# Patient Record
Sex: Male | Born: 1985 | Race: Black or African American | Hispanic: No | Marital: Single | Smoking: Never smoker
Health system: Southern US, Community
[De-identification: ages and names within clinical notes are randomized; demographics above are authoritative.]

## PROBLEM LIST (undated history)

## (undated) DIAGNOSIS — B1910 Unspecified viral hepatitis B without hepatic coma: Secondary | ICD-10-CM

## (undated) HISTORY — DX: Unspecified viral hepatitis B without hepatic coma: B19.10

---

## 2011-05-11 ENCOUNTER — Encounter: Payer: Self-pay | Admitting: Gastroenterology

## 2011-05-16 ENCOUNTER — Encounter: Payer: Self-pay | Admitting: Gastroenterology

## 2011-07-06 ENCOUNTER — Ambulatory Visit: Payer: Self-pay | Admitting: Gastroenterology

## 2012-03-14 ENCOUNTER — Ambulatory Visit (INDEPENDENT_AMBULATORY_CARE_PROVIDER_SITE_OTHER): Payer: Managed Care, Other (non HMO) | Admitting: Family Medicine

## 2012-03-14 VITALS — BP 138/76 | HR 71 | Temp 98.4°F | Resp 16 | Ht 67.5 in | Wt 193.8 lb

## 2012-03-14 DIAGNOSIS — Z0000 Encounter for general adult medical examination without abnormal findings: Secondary | ICD-10-CM

## 2012-03-14 DIAGNOSIS — H109 Unspecified conjunctivitis: Secondary | ICD-10-CM

## 2012-03-14 DIAGNOSIS — H1045 Other chronic allergic conjunctivitis: Secondary | ICD-10-CM

## 2012-03-14 DIAGNOSIS — Z113 Encounter for screening for infections with a predominantly sexual mode of transmission: Secondary | ICD-10-CM

## 2012-03-14 DIAGNOSIS — J069 Acute upper respiratory infection, unspecified: Secondary | ICD-10-CM

## 2012-03-14 DIAGNOSIS — H1010 Acute atopic conjunctivitis, unspecified eye: Secondary | ICD-10-CM

## 2012-03-14 LAB — POCT CBC
HCT, POC: 49.1 % (ref 43.5–53.7)
MCH, POC: 29.9 pg (ref 27–31.2)
MCV: 90.5 fL (ref 80–97)
MID (cbc): 0.5 (ref 0–0.9)
POC LYMPH PERCENT: 31 %L (ref 10–50)
Platelet Count, POC: 206 10*3/uL (ref 142–424)
RBC: 5.42 M/uL (ref 4.69–6.13)
WBC: 6.5 10*3/uL (ref 4.6–10.2)

## 2012-03-14 LAB — COMPREHENSIVE METABOLIC PANEL
Albumin: 4.6 g/dL (ref 3.5–5.2)
Alkaline Phosphatase: 72 U/L (ref 39–117)
BUN: 8 mg/dL (ref 6–23)
CO2: 28 mEq/L (ref 19–32)
Calcium: 10.2 mg/dL (ref 8.4–10.5)
Chloride: 107 mEq/L (ref 96–112)
Glucose, Bld: 96 mg/dL (ref 70–99)
Potassium: 4.2 mEq/L (ref 3.5–5.3)
Total Protein: 7.3 g/dL (ref 6.0–8.3)

## 2012-03-14 LAB — RPR

## 2012-03-14 MED ORDER — AZELASTINE HCL 0.05 % OP SOLN: 1.0000 [drp] | Freq: Two times a day (BID) | OPHTHALMIC | Status: AC

## 2012-03-14 NOTE — Progress Notes (Signed)
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 He is going to be getting married in July to a lady from his country was in [place]. He will be going back to the [place] him to get married. He is here for a premarital physical examination, desiring to have baseline lab testing and STD screening done to be able to present to his future in-laws.  He is fairly healthy with an essentially negative review of systems. HEENT normal with the exception of having had a little conjunctivitis for the past few days while riding an airplane he got this. He also has had a little upper respiratory congestion sore throat. He has some cardiovascular respiratory GI GU musculoskeletal, to logic are all negative.  Objective: HEENT: TMs are normal. Throat clear. Has a little bit of postnasal drainage. Neck supple without significant nodes. Eyes have minimal inflammation. Neck supple without nodes. Chest is clear. Heart regular without murmurs. It is soft without masses or tenderness. Normal male external genitalia testes descended. No lesions are noted. Extremities unremarkable. Skin has a few old scars on his legs otherwise unremarkable.  Assessment: Healthy male with normal premarital examination URI/allergic conjunctivitis  Plan:   Prescribed some eyedrops for him for the itching and crusting of the eyes. Also suggest she take some OTC Claritin D. Labs ordered include HIV, URiprobe, RPR, blood chemistries, CBC. His father had diabetes also get a hemoglobin A1c.

## 2012-03-14 NOTE — Patient Instructions (Signed)
Take claritin D daily as needed for congestion and sore throat

## 2012-03-15 LAB — GC/CHLAMYDIA PROBE AMP, URINE
Chlamydia, Swab/Urine, PCR: NEGATIVE
GC Probe Amp, Urine: NEGATIVE

## 2012-11-28 ENCOUNTER — Ambulatory Visit: Payer: Managed Care, Other (non HMO)

## 2012-11-30 ENCOUNTER — Ambulatory Visit (INDEPENDENT_AMBULATORY_CARE_PROVIDER_SITE_OTHER): Payer: Managed Care, Other (non HMO) | Admitting: Physician Assistant

## 2012-11-30 ENCOUNTER — Other Ambulatory Visit: Payer: Self-pay | Admitting: Physician Assistant

## 2012-11-30 VITALS — BP 128/76 | HR 114 | Temp 98.0°F | Resp 16 | Ht 68.0 in | Wt 185.0 lb

## 2012-11-30 DIAGNOSIS — Z111 Encounter for screening for respiratory tuberculosis: Secondary | ICD-10-CM

## 2012-11-30 DIAGNOSIS — Z7185 Encounter for immunization safety counseling: Secondary | ICD-10-CM

## 2012-11-30 DIAGNOSIS — Z23 Encounter for immunization: Secondary | ICD-10-CM

## 2012-11-30 DIAGNOSIS — Z7189 Other specified counseling: Secondary | ICD-10-CM

## 2012-11-30 NOTE — Progress Notes (Signed)
  Subjective:    Patient ID: Randy Jennings, male    DOB: 09/19/1986, 27 y.o.   MRN: [id number]  HPI 27 year old male presents for immunization review.  He is needing vaccines/titers for school.  He has no records of immunizations from childhood, although he states he has had all childhood immunizations.  Needs to re-start tetanus series and also requires MMR titers.  He does not have any forms that need to be completed and also does not need a physical.     Review of Systems  Constitutional: Negative for fever, chills and unexpected weight change.  Respiratory: Negative for cough.   Gastrointestinal: Negative for nausea and vomiting.  Neurological: Negative for headaches.       Objective:   Physical Exam  Constitutional: He is oriented to person, place, and time. He appears well-developed and well-nourished.  HENT:  Head: Normocephalic and atraumatic.  Right Ear: External ear normal.  Left Ear: External ear normal.  Eyes: Conjunctivae are normal.  Neck: Normal range of motion.  Cardiovascular: Normal rate.   Pulmonary/Chest: Effort normal.  Neurological: He is alert and oriented to person, place, and time.  Psychiatric: He has a normal mood and affect. His behavior is normal. Judgment and thought content normal.          Assessment & Plan:  1. Immunization counseling - Plan: Rubeola antibody, IgM, Mumps antibody, IgM, Rubella antibody, IgM  -titers pending  2. Need for Tdap vaccination - Plan: Tdap vaccine greater than or equal to 7yo IM  -1st Tdap given today. Return in 4 weeks for next dose.   3 Screening for tuberculosis  -patient screened according to guidelines and found to be free of TB in the communicable form.   -letter printed.

## 2012-11-30 NOTE — Progress Notes (Signed)
  Tuberculosis Risk Questionnaire  1. Were you born outside the [place] in one of the following parts of the world:    [place], [place], [place], [place] or [place]?  Yes [place]  2. Have you traveled outside the [place] and lived for more than one month in one of the following parts of the world:  [place], [place], [place], [place] or [place]?  Yes Lived in [place] and [place]  3. Do you have a compromised immune system such as from any of the following conditions:  HIV/AIDS, organ or bone marrow transplantation, diabetes, immunosuppressive   medicines (e.g. Prednisone, Remicaide), leukemia, lymphoma, cancer of the   head or neck, gastrectomy or jejunal bypass, end-stage renal disease (on   dialysis), or silicosis?  No    4. Have you ever done one of the following:    Used crack cocaine, injected illegal drugs, worked or resided in jail or prison,   worked or resided at a homeless shelter, or worked as a Research scientist (physical sciences) in   direct contact with patients?  No  5. Have you ever been exposed to anyone with infectious tuberculosis?  No   Tuberculosis Symptom Questionnaire  Do you currently have any of the following symptoms?  1. Unexplained cough lasting more than 3 weeks? No  Unexplained fever lasting more than 3 weeks. No   3. Night Sweats (sweating that leaves the bedclothes and sheets wet)   No  4. Shortness of Breath No  5. Chest Pain No  6. Unintentional weight loss  No  7. Unexplained fatigue (very tired for no reason) No

## 2012-12-04 LAB — MUMPS ANTIBODY, IGM: Mumps IgM Value: <1:20 {titer}

## 2012-12-04 LAB — RUBELLA ANTIBODY, IGM: Rubella IgM: 0.38 (ref <0.90)

## 2012-12-04 LAB — RUBEOLA ANTIBODY, IGM: Measles Ab IgM, IFA: <1:20 {titer}

## 2012-12-05 LAB — VARICELLA ZOSTER ANTIBODY, IGG: Varicella IgG: 713.3 Index — ABNORMAL HIGH (ref <135.00)

## 2012-12-05 LAB — MEASLES/MUMPS/RUBELLA IMMUNITY
Mumps IgG: 8.87 AU/mL (ref <9.00)
Rubella: 6.71 Index — ABNORMAL HIGH (ref <0.90)
Rubeola IgG: 159 AU/mL — ABNORMAL HIGH (ref <25.00)

## 2012-12-05 NOTE — Addendum Note (Signed)
Addended by: Nelva Nay on: 12/05/2012 07:07 PM   Modules accepted: Orders

## 2012-12-06 ENCOUNTER — Ambulatory Visit (INDEPENDENT_AMBULATORY_CARE_PROVIDER_SITE_OTHER): Payer: Managed Care, Other (non HMO)

## 2012-12-06 DIAGNOSIS — Z23 Encounter for immunization: Secondary | ICD-10-CM

## 2012-12-24 ENCOUNTER — Ambulatory Visit (INDEPENDENT_AMBULATORY_CARE_PROVIDER_SITE_OTHER): Payer: Managed Care, Other (non HMO) | Admitting: Physician Assistant

## 2012-12-24 VITALS — BP 132/86 | HR 92 | Temp 98.1°F | Resp 16 | Ht 69.0 in | Wt 190.0 lb

## 2012-12-24 DIAGNOSIS — Z23 Encounter for immunization: Secondary | ICD-10-CM

## 2012-12-24 NOTE — Progress Notes (Signed)
Patient here for MMR #2 and Tdap vaccination. Ok to give. Not seen by provider.

## 2013-02-22 ENCOUNTER — Ambulatory Visit (INDEPENDENT_AMBULATORY_CARE_PROVIDER_SITE_OTHER): Payer: Managed Care, Other (non HMO) | Admitting: Family Medicine

## 2013-02-22 VITALS — BP 112/70 | HR 96 | Temp 99.0°F | Resp 16 | Ht 68.0 in | Wt 186.6 lb

## 2013-02-22 DIAGNOSIS — K645 Perianal venous thrombosis: Secondary | ICD-10-CM

## 2013-02-22 MED ORDER — HYDROCORTISONE ACE-PRAMOXINE 2.5-1 % RE CREA: TOPICAL_CREAM | Freq: Three times a day (TID) | RECTAL | Status: DC

## 2013-02-22 NOTE — Patient Instructions (Signed)
Hemorrhoids Hemorrhoids are enlarged (dilated) veins around the rectum. There are 2 types of hemorrhoids, and the type of hemorrhoid is determined by its location. Internal hemorrhoids occur in the veins just inside the rectum.They are usually not painful, but they may bleed.However, they may poke through to the outside and become irritated and painful. External hemorrhoids involve the veins outside the anus and can be felt as a painful swelling or hard lump near the anus.They are often itchy and may crack and bleed. Sometimes clots will form in the veins. This makes them swollen and painful. These are called thrombosed hemorrhoids. CAUSES Causes of hemorrhoids include:  Pregnancy. This increases the pressure in the hemorrhoidal veins.  Constipation.  Straining to have a bowel movement.  Obesity.  Heavy lifting or other activity that caused you to strain. TREATMENT Most of the time hemorrhoids improve in 1 to 2 weeks. However, if symptoms do not seem to be getting better or if you have a lot of rectal bleeding, your caregiver may perform a procedure to help make the hemorrhoids get smaller or remove them completely.Possible treatments include:  Rubber band ligation. A rubber band is placed at the base of the hemorrhoid to cut off the circulation.  Sclerotherapy. A chemical is injected to shrink the hemorrhoid.  Infrared light therapy. Tools are used to burn the hemorrhoid.  Hemorrhoidectomy. This is surgical removal of the hemorrhoid. HOME CARE INSTRUCTIONS   Increase fiber in your diet. Ask your caregiver about using fiber supplements.  Drink enough water and fluids to keep your urine clear or pale yellow.  Exercise regularly.  Go to the bathroom when you have the urge to have a bowel movement. Do not wait.  Avoid straining to have bowel movements.  Keep the anal area dry and clean.  Only take over-the-counter or prescription medicines for pain, discomfort, or fever as  directed by your caregiver. If your hemorrhoids are thrombosed:  Take warm sitz baths for 20 to 30 minutes, 3 to 4 times per day.  If the hemorrhoids are very tender and swollen, place ice packs on the area as tolerated. Using ice packs between sitz baths may be helpful. Fill a plastic bag with ice. Place a towel between the bag of ice and your skin.  Medicated creams and suppositories may be used or applied as directed.  Do not use a donut-shaped pillow or sit on the toilet for long periods. This increases blood pooling and pain. SEEK MEDICAL CARE IF:   You have increasing pain and swelling that is not controlled with your medicine.  You have uncontrolled bleeding.  You have difficulty or you are unable to have a bowel movement.  You have pain or inflammation outside the area of the hemorrhoids.  You have chills or an oral temperature above 102 F (38.9 C). MAKE SURE YOU:   Understand these instructions.  Will watch your condition.  Will get help right away if you are not doing well or get worse. Document Released: 09/30/2000 Document Revised: 12/26/2011 Document Reviewed: 09/13/2010 [facility] Patient Information 2013 [place], [place].    Take Colace one daily for stool softener  Use the cream 2-3 times daily on hemorrhoid as directed

## 2013-02-22 NOTE — Progress Notes (Signed)
Subjective:  Patient presents here today complaining of a hemorrhoid. A few years ago he had one that he went to the emergency room for. History with suppositories and some cream. The past couple of days he has felt more it swelled up. He has not been particularly straining and his stools. 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 He has not had any bleeding, and not much pain.  Objective: Mildly tender external thrombosed hemorrhoid, about 1.5 CM's in diameter, not particularly inflamed.  Assessment: Thrombosed external hemorrhoid  Plan: Discuss treatment options. I think he is better treated conservatively at this time. If he gets more swollen or more painful he should come in and we will do an I&D on it.

## 2014-02-01 ENCOUNTER — Ambulatory Visit (INDEPENDENT_AMBULATORY_CARE_PROVIDER_SITE_OTHER): Payer: Managed Care, Other (non HMO) | Admitting: Family Medicine

## 2014-02-01 VITALS — BP 112/60 | HR 73 | Temp 98.3°F | Resp 14 | Ht 68.0 in | Wt 193.4 lb

## 2014-02-01 DIAGNOSIS — R519 Headache, unspecified: Secondary | ICD-10-CM

## 2014-02-01 DIAGNOSIS — R51 Headache: Secondary | ICD-10-CM

## 2014-02-01 DIAGNOSIS — G43109 Migraine with aura, not intractable, without status migrainosus: Secondary | ICD-10-CM

## 2014-02-01 LAB — POCT CBC
Granulocyte percent: 56.6 % (ref 37–80)
HCT, POC: 47.4 % (ref 43.5–53.7)
Hemoglobin: 15.6 g/dL (ref 14.1–18.1)
Lymph, poc: 1.8 (ref 0.6–3.4)
MCH, POC: 30.5 pg (ref 27–31.2)
MCHC: 32.9 g/dL (ref 31.8–35.4)
MCV: 92.6 fL (ref 80–97)
MID (cbc): 0.3 (ref 0–0.9)
MPV: 9.4 fL (ref 0–99.8)
POC Granulocyte: 2.8 (ref 2–6.9)
POC LYMPH PERCENT: 36.6 %L (ref 10–50)
POC MID %: 6.8 %M (ref 0–12)
Platelet Count, POC: 165 10*3/uL (ref 142–424)
RBC: 5.12 M/uL (ref 4.69–6.13)
RDW, POC: 14 %
WBC: 5 10*3/uL (ref 4.6–10.2)

## 2014-02-01 MED ORDER — BUTALBITAL-APAP-CAFFEINE 50-325-40 MG PO TABS: 1.0000 | ORAL_TABLET | Freq: Three times a day (TID) | ORAL | Status: AC | PRN

## 2014-02-01 NOTE — Patient Instructions (Signed)
Migraine Headache A migraine headache is an intense, throbbing pain on one or both sides of your head. A migraine can last for 30 minutes to several hours. CAUSES  The exact cause of a migraine headache is not always known. However, a migraine may be caused when nerves in the brain become irritated and release chemicals that cause inflammation. This causes pain. Certain things may also trigger migraines, such as:  Alcohol.  Smoking.  Stress.  Menstruation.  Aged cheeses.  Foods or drinks that contain nitrates, glutamate, aspartame, or tyramine.  Lack of sleep.  Chocolate.  Caffeine.  Hunger.  Physical exertion.  Fatigue.  Medicines used to treat chest pain (nitroglycerine), birth control pills, estrogen, and some blood pressure medicines. SIGNS AND SYMPTOMS  Pain on one or both sides of your head.  Pulsating or throbbing pain.  Severe pain that prevents daily activities.  Pain that is aggravated by any physical activity.  Nausea, vomiting, or both.  Dizziness.  Pain with exposure to bright lights, loud noises, or activity.  General sensitivity to bright lights, loud noises, or smells. Before you get a migraine, you may get warning signs that a migraine is coming (aura). An aura may include:  Seeing flashing lights.  Seeing bright spots, halos, or zig-zag lines.  Having tunnel vision or blurred vision.  Having feelings of numbness or tingling.  Having trouble talking.  Having muscle weakness. DIAGNOSIS  A migraine headache is often diagnosed based on:  Symptoms.  Physical exam.  A CT scan or MRI of your head. These imaging tests cannot diagnose migraines, but they can help rule out other causes of headaches. TREATMENT Medicines may be given for pain and nausea. Medicines can also be given to help prevent recurrent migraines.  HOME CARE INSTRUCTIONS  Only take over-the-counter or prescription medicines for pain or discomfort as directed by your  health care provider. The use of long-term narcotics is not recommended.  Lie down in a dark, quiet room when you have a migraine.  Keep a journal to find out what may trigger your migraine headaches. For example, write down:  What you eat and drink.  How much sleep you get.  Any change to your diet or medicines.  Limit alcohol consumption.  Quit smoking if you smoke.  Get 7 9 hours of sleep, or as recommended by your health care provider.  Limit stress.  Keep lights dim if bright lights bother you and make your migraines worse. SEEK IMMEDIATE MEDICAL CARE IF:   Your migraine becomes severe.  You have a fever.  You have a stiff neck.  You have vision loss.  You have muscular weakness or loss of muscle control.  You start losing your balance or have trouble walking.  You feel faint or pass out.  You have severe symptoms that are different from your first symptoms. MAKE SURE YOU:   Understand these instructions.  Will watch your condition.  Will get help right away if you are not doing well or get worse. Document Released: 10/03/2005 Document Revised: 07/24/2013 Document Reviewed: 06/10/2013 [employer] Patient Information 2014 [employer].  

## 2014-02-01 NOTE — Progress Notes (Signed)
Chief Complaint:  Chief Complaint  Patient presents with  . Headache    x 3 days    HPI: Randy Jennings is a 28 y.o. male who is here for  3 day history of headache where he has vision changes first , his vision gets blurred then abput 10-15 minutes he feels the pain in his head. He feels it "inside his head, in the center of his head". He has had HA in the past but nothing similar. It is constant. The vision changes usually involve blurred vision and white spots in his vision then 15 min later followed by  the headache. In the past he has never had vision changes. He has tried nothing for this except low dose ibuprofen . He has taken ibuprofen. Just 2 pills, the HA went away. The other day the HA lasted 2 hrs and went way. This one started 2 hours ago.   His sister has migraine HAs.  Denies neck pain, nause , vomitn, CP or SOb, paloputations.  He usually gets HA every 6 months He is deneis eye pain full HA, discomfort. No neck pain   No past medical history on file. No past surgical history on file. History   Social History  . Marital Status: Single    Spouse Name: N/A    Number of Children: N/A  . Years of Education: N/A   Social History Main Topics  . Smoking status: Never Smoker   . Smokeless tobacco: None  . Alcohol Use: 0.6 oz/week    1 Glasses of wine per week  . Drug Use: No  . Sexual Activity: Yes   Other Topics Concern  . None   Social History Narrative  . None   Family History  Problem Relation Age of Onset  . Diabetes Father    No Known Allergies Prior to Admission medications   Medication Sig Start Date End Date Taking? Authorizing Provider  hydrocortisone-pramoxine [facility](ANALPRAM-HC) 2.5-1 % rectal cream Place rectally 3 (three) times daily. 02/22/13  Yes Peyton Najjaravid H Hopper, MD     ROS: The patient denies fevers, chills, night sweats, unintentional weight loss, chest pain, palpitations, wheezing, dyspnea on exertion, nausea, vomiting, abdominal pain,  dysuria, hematuria, melena, numbness, weakness, or tingling.   All other systems have been reviewed and were otherwise negative with the exception of those mentioned in the HPI and as above.    PHYSICAL EXAM: Filed Vitals:   02/01/14 1605  BP: 112/60  Pulse: 73  Temp: 98.3 F (36.8 C)  Resp: 14  Spo2 99% Filed Vitals:   02/01/14 1605  Height: 5\' 8"  (1.727 m)  Weight: 193 lb 6.4 oz (87.726 kg)   Body mass index is 29.41 kg/(m^2).  General: Alert, no acute distress HEENT:  Normocephalic, atraumatic, oropharynx patent. EOMI, PERRLA, fundoscopic exam normal, TM nl.  Cardiovascular:  Regular rate and rhythm, no rubs murmurs or gallops.  No Carotid bruits, radial pulse intact. No pedal edema.  Respiratory: Clear to auscultation bilaterally.  No wheezes, rales, or rhonchi.  No cyanosis, no use of accessory musculature GI: No organomegaly, abdomen is soft and non-tender, positive bowel sounds.  No masses. Skin: No rashes. Neurologic: Facial musculature symmetric. CN 2-12 grossly intact, cerebellar fxn nl Psychiatric: Patient is appropriate throughout our interaction. Lymphatic: No cervical lymphadenopathy Musculoskeletal: Gait intact. 5/5 strength   LABS: Results for orders placed in visit on 02/01/14  POCT CBC      Result Value Ref Range   WBC  5.0  4.6 - 10.2 K/uL   Lymph, poc 1.8  0.6 - 3.4   POC LYMPH PERCENT 36.6  10 - 50 %L   MID (cbc) 0.3  0 - 0.9   POC MID % 6.8  0 - 12 %M   POC Granulocyte 2.8  2 - 6.9   Granulocyte percent 56.6  37 - 80 %G   RBC 5.12  4.69 - 6.13 M/uL   Hemoglobin 15.6  14.1 - 18.1 g/dL   HCT, POC 16.147.4  09.643.5 - 53.7 %   MCV 92.6  80 - 97 fL   MCH, POC 30.5  27 - 31.2 pg   MCHC 32.9  31.8 - 35.4 g/dL   RDW, POC 04.514.0     Platelet Count, POC 165  142 - 424 K/uL   MPV 9.4  0 - 99.8 fL     EKG/XRAY:   Primary [facility]   ASSESSMENT/PLAN: Encounter Diagnoses  Name Primary?  . Generalized headaches Yes  . Migraine with  acute onset aura    Randy Jennings is a very pleasant young 28 y.o man from [place] who is here with a 3 day history of intermittent migraine with aura ( he sees bright spots 15 min prior to HA onset ) which has been lasting for 2 hours at a time. He has no known triggers, does have a sister with migraines. No other neurological sxs associated with this. He does feel he is going to die everytime he has some ailment so that is why he is here today. He does not like taking medicines. But he is here today because he has had HA in the past but has never had vision changes. Today's  eye and neuro exam were both normal, VSS.   Will monitor for worsening sxs Otc exederin If no improvement then will fill rx of fioricet F/u prn if worsening sxs then consider advance imaging  Gross sideeffects, risk and benefits, and alternatives of medications d/w patient. Patient is aware that all medications have potential sideeffects and we are unable to predict every sideeffect or drug-drug interaction that may occur.  Lenell Antuhao P Le, DO 02/01/2014 4:59 PM

## 2017-03-18 ENCOUNTER — Other Ambulatory Visit: Payer: Self-pay | Admitting: Family Medicine

## 2017-03-18 DIAGNOSIS — K645 Perianal venous thrombosis: Secondary | ICD-10-CM

## 2017-03-28 ENCOUNTER — Ambulatory Visit (INDEPENDENT_AMBULATORY_CARE_PROVIDER_SITE_OTHER): Payer: Managed Care, Other (non HMO) | Admitting: Urgent Care

## 2017-03-28 ENCOUNTER — Encounter: Payer: Self-pay | Admitting: Urgent Care

## 2017-03-28 VITALS — BP 128/81 | HR 59 | Temp 98.3°F | Resp 16 | Ht 68.0 in | Wt 206.4 lb

## 2017-03-28 DIAGNOSIS — Z7189 Other specified counseling: Secondary | ICD-10-CM

## 2017-03-28 DIAGNOSIS — Z7185 Encounter for immunization safety counseling: Secondary | ICD-10-CM

## 2017-03-28 DIAGNOSIS — Z23 Encounter for immunization: Secondary | ICD-10-CM

## 2017-03-28 DIAGNOSIS — Z1159 Encounter for screening for other viral diseases: Secondary | ICD-10-CM

## 2017-03-28 MED ORDER — MEASLES, MUMPS & RUBELLA VAC ~~LOC~~ INJ
0.5000 mL | INJECTION | Freq: Once | SUBCUTANEOUS | Status: AC
  Administered 2017-03-28: 0.5 mL via SUBCUTANEOUS

## 2017-03-28 MED ORDER — TYPHOID VACCINE PO CPDR: 1.0000 | DELAYED_RELEASE_CAPSULE | ORAL | 0 refills | Status: DC

## 2017-03-28 NOTE — Patient Instructions (Addendum)
MMR (Measles, Mumps and Rubella) Vaccine: What You Need to Know 1. Why get vaccinated? Measles, mumps, and rubella are viral diseases that can have serious consequences. Before vaccines, these diseases were very common in the [place], especially among children. They are still common in many parts of the world. Measles  Measles virus causes symptoms that can include fever, cough, runny nose, and red, watery eyes, commonly followed by a rash that covers the whole body.  Measles can lead to ear infections, diarrhea, and infection of the lungs (pneumonia). Rarely, measles can cause brain damage or death. Mumps  Mumps virus causes fever, headache, muscle aches, tiredness, loss of appetite, and swollen and tender salivary glands under the ears on one or both sides.  Mumps can lead to deafness, swelling of the brain and/or spinal cord covering (encephalitis or meningitis), painful swelling of the testicles or ovaries, and, very rarely, death. Rubella (also known as [place] Measles)  Rubella virus causes fever, sore throat, rash, headache, and eye irritation.  Rubella can cause arthritis in up to half of teenage and adult women.  If a woman gets rubella while she is pregnant, she could have a miscarriage or her baby could be born with serious birth defects. These diseases can easily spread from person to person. Measles doesn't even require personal contact. You can get measles by entering a room that a person with measles left up to 2 hours before. Vaccines and high rates of vaccination have made these diseases much less common in the [place]. 2. MMR vaccine Children should get 2 doses of MMR vaccine, usually:  First dose: 12 through 44 months of age  Second dose: 74 through 33 years of age  71 who will be traveling outside the [place] when they are between 41 and 60 months of age should get a dose of MMR vaccine before travel. This can provide temporary protection from  measles infection, but will not give permanent immunity. The child should still get 2 doses at the recommended ages for long-lasting protection. Adults might also need MMR vaccine. Many adults 60 years of age and older might be susceptible to measles, mumps, and rubella without knowing it. A third dose of MMR might be recommended in certain mumps outbreak situations. There are no known risks to getting MMR vaccine at the same time as other vaccines. There is a combination vaccine called MMRV that contains both chickenpox and MMR vaccines. MMRV is an option for some children 12 months through 42 years of age. There is a separate Vaccine Information Statement for MMRV. Your health care provider can give you more information. 3. Some people should not get this vaccine Tell your vaccine provider if the person getting the vaccine:  Has any severe, life-threatening allergies. A person who has ever had a life-threatening allergic reaction after a dose of MMR vaccine, or has a severe allergy to any part of this vaccine, may be advised not to be vaccinated. Ask your health care provider if you want information about vaccine components.  Is pregnant, or thinks she might be pregnant. Pregnant women should wait to get MMR vaccine until after they are no longer pregnant. Women should avoid getting pregnant for at least 1 month after getting MMR vaccine.  Has a weakened immune system due to disease (such as cancer or HIV/AIDS) or medical treatments (such as radiation, immunotherapy, steroids, or chemotherapy).  Has a parent, brother, or sister with a history of immune system problems.  Has ever  had a condition that makes them bruise or bleed easily.  Has recently had a blood transfusion or received other blood products. You might be advised to postpone MMR vaccination for 3 months or more.  Has tuberculosis.  Has gotten any other vaccines in the past 4 weeks. Live vaccines given too close together might not  work as well.  Is not feeling well. A mild illness, such as a cold, is usually not a reason to postpone a vaccination. Someone who is moderately or severely ill should probably wait. Your doctor can advise you.  4. Risks of a vaccine reaction With any medicine, including vaccines, there is a chance of reactions. These are usually mild and go away on their own, but serious reactions are also possible. Getting MMR vaccine is much safer than getting measles, mumps, or rubella disease. Most people who get MMR vaccine do not have any problems with it. After MMR vaccination, a person might experience: Minor events:  Sore arm from the injection  Fever  Redness or rash at the injection site  Swelling of glands in the cheeks or neck If these events happen, they usually begin within 2 weeks after the shot. They occur less often after the second dose. Moderate events:  Seizure (jerking or staring) often associated with fever  Temporary pain and stiffness in the joints, mostly in teenage or adult women  Temporary low platelet count, which can cause unusual bleeding or bruising  Rash all over body Severe events occur very rarely:  Deafness  Long-term seizures, coma, or lowered consciousness  Brain damage Other things that could happen after this vaccine:  People sometimes faint after medical procedures, including vaccination. Sitting or lying down for about 15 minutes can help prevent fainting and injuries caused by a fall. Tell your provider if you feel dizzy or have vision changes or ringing in the ears.  Some people get shoulder pain that can be more severe and longer-lasting than routine soreness that can follow injections. This happens very rarely.  Any medication can cause a severe allergic reaction. Such reactions to a vaccine are estimated at about 1 in a million doses, and would happen within a few minutes to a few hours after the vaccination. As with any medicine, there is a  very remote chance of a vaccine causing a serious injury or death. The safety of vaccines is always being monitored. For more information, visit: http://www.aguilar.org/ 5. What if there is a serious problem? What should I look for?  Look for anything that concerns you, such as signs of a severe allergic reaction, very high fever, or unusual behavior. Signs of a severe allergic reaction can include hives, swelling of the face and throat, difficulty breathing, a fast heartbeat, dizziness, and weakness. These would usually start a few minutes to a few hours after the vaccination. What should I do?  If you think it is a severe allergic reaction or other emergency that can't wait, call 9-1-1 and get to the nearest hospital. Otherwise, call your health care provider.  Afterward, the reaction should be reported to the Vaccine Adverse Event Reporting System (VAERS). Your doctor should file this report, or you can do it yourself through the VAERS web site at www.vaers.SamedayNews.es, or by calling 865-261-6717. ? VAERS does not give medical advice. 6. The National Vaccine Injury Compensation Program The [employer] Vaccine Injury Compensation Program (VICP) is a federal program that was created to compensate people who may have been injured by certain vaccines. Persons who  believe they may have been injured by a vaccine can learn about the program and about filing a claim by calling 930 189 6944 or visiting the [place] website at GoldCloset.com.ee. There is a time limit to file a claim for compensation. 7. How can I learn more?  Ask your healthcare provider. He or she can give you the vaccine package insert or suggest other sources of information.  Call your local or state health department.  Contact the Centers for Disease Control and Prevention (CDC): ? Call 412-295-9707 (1-800-CDC-INFO)  or ? Visit CDC's website at http://hunter.com/ CDC Vaccine Information Statement (VIS) MMR  Vaccine (11/28/2016) This information is not intended to replace advice given to you by your health care provider. Make sure you discuss any questions you have with your health care provider. Document Released: 07/31/2006 Document Revised: 12/13/2016 Document Reviewed: 12/13/2016 Elsevier Interactive Patient Education  2018 [employer].     IF you received an x-ray today, you will receive an invoice from [facility]. Please contact [facility] at [phone] with questions or concerns regarding your invoice.   IF you received labwork today, you will receive an invoice from [place]. Please contact [facility] at [phone] with questions or concerns regarding your invoice.   Our billing staff will not be able to assist you with questions regarding bills from these companies.  You will be contacted with the lab results as soon as they are available. The fastest way to get your results is to activate your My Chart account. Instructions are located on the last page of this paperwork. If you have not heard from [place] regarding the results in 2 weeks, please contact this office.

## 2017-03-28 NOTE — Progress Notes (Signed)
   MRN: [id number] DOB: 21-Sep-1986  Subjective:   Randy Jennings is a 31 y.o. male presenting for chief complaint of Immunizations (patient needs MMR for school) and Blood work (would like to have Hep B and Hep C done)  Patient is going to school to [employer]. He wants immunization for MMR. He would also like titers done for hepatitis B and C. Patient is also traveling to [place] for ~1 year. Needs immunization against typhoid and malaria. He has received immunization for typhoid before last done in 2014. He has previously received vaccination for hepatitis A. Denies jaundice, fever, n/v, abdominal pain, rashes.   Randy Jennings is not currently taking any medications. Also has No Known Allergies. Randy Jennings denies past medical and surgical history. His family history includes Diabetes in his father. His father also had Hepatitis B and is very concerning to the patient for his own medical health.  Objective:   Vitals: BP 128/81   Pulse (!) 59   Temp 98.3 F (36.8 C) (Oral)   Resp 16   Ht _0  (1.727 m)   Wt 206 lb 6.4 oz (93.6 kg)   SpO2 98%   BMI 31.38 kg/m   Physical Exam  Constitutional: He is oriented to person, place, and time. He appears well-developed and well-nourished.  Eyes: No scleral icterus.  Cardiovascular: Normal rate.   Pulmonary/Chest: Effort normal.  Neurological: He is alert and oriented to person, place, and time. Coordination normal.  Psychiatric: He has a normal mood and affect.   Assessment and Plan :   1. Encounter for counseling regarding immunization - Counseled on hepatitis blood work. - Hepatitis B surface antibody - Hepatitis C antibody - Hepatitis B surface antigen  2. Need for MMR vaccine - Updated today. - measles, mumps and rubella vaccine (MMR) injection 0.5 mL; Inject 0.5 mLs into the skin once.  3. Need for hepatitis B screening test - Hepatitis B surface antibody - Hepatitis B surface antigen  4. Need for hepatitis C screening test -  Hepatitis C antibody  5. Need for immunization against typhoid - Counseled patient on Vivotif being good for 5 years. - typhoid (VIVOTIF) DR capsule; Take 1 capsule by mouth every other day. X 4 doses.  Dispense: 4 capsule; Refill: 0   Jaynee Eagles, PA-C [facility] [phone] 03/28/2017  9:46 AM

## 2017-03-29 LAB — HEPATITIS C ANTIBODY

## 2017-03-29 LAB — HEPATITIS B SURFACE ANTIBODY, QUANTITATIVE: Hepatitis B Surf Ab Quant: <3.1 m[IU]/mL — ABNORMAL LOW (ref >9.9)

## 2017-03-29 LAB — HEPATITIS B SURFACE ANTIGEN: HEP B S AG: POSITIVE — AB

## 2017-04-03 ENCOUNTER — Telehealth: Payer: Self-pay | Admitting: Urgent Care

## 2017-04-03 DIAGNOSIS — B1910 Unspecified viral hepatitis B without hepatic coma: Secondary | ICD-10-CM

## 2017-04-03 NOTE — Telephone Encounter (Signed)
Reported positive Hepatitis B result. Will refer to GI for further work up and management.

## 2017-04-03 NOTE — Telephone Encounter (Signed)
Pt is looking for lab results   Best number [phone]

## 2017-04-03 NOTE — Telephone Encounter (Signed)
Please release results

## 2017-04-28 ENCOUNTER — Telehealth: Payer: Self-pay | Admitting: Urgent Care

## 2017-04-28 NOTE — Telephone Encounter (Signed)
Pt returned call and said he spoke with [facility] and they are able to treat him for Hep B they just need a referral. Referral sent on 7/13

## 2017-04-28 NOTE — Telephone Encounter (Signed)
Pt called and left vm concerning referral for [facility]. He said he contacted them to schedule and they were unable to schedule with him because their doctor has not reviewed the notes yet. Pt is requesting to be sent somewhere that can see him sooner. I tried calling the pt back to give him the information to contact the health department. I was unable to leave a detailed message so I asked that he call back. If pt's calls back, he could try to contact the [place] [facility] [phone]) and does not need a referral for this. If there is anywhere else we can send please let me know and I will send referral elsewhere. Thanks!

## 2017-04-28 NOTE — Telephone Encounter (Signed)
FYI

## 2017-05-01 NOTE — Telephone Encounter (Signed)
Great work!  Thank you.

## 2017-05-17 ENCOUNTER — Other Ambulatory Visit: Payer: Self-pay | Admitting: Gastroenterology

## 2017-05-17 DIAGNOSIS — B181 Chronic viral hepatitis B without delta-agent: Secondary | ICD-10-CM

## 2017-05-24 ENCOUNTER — Ambulatory Visit
Admission: RE | Admit: 2017-05-24 | Discharge: 2017-05-24 | Disposition: A | Discharge status: Home / Self Care | Payer: Managed Care, Other (non HMO) | Source: Ambulatory Visit | Attending: Gastroenterology | Admitting: Gastroenterology

## 2017-05-24 DIAGNOSIS — B181 Chronic viral hepatitis B without delta-agent: Secondary | ICD-10-CM

## 2017-05-29 DIAGNOSIS — B181 Chronic viral hepatitis B without delta-agent: Secondary | ICD-10-CM

## 2017-05-29 DIAGNOSIS — B1910 Unspecified viral hepatitis B without hepatic coma: Secondary | ICD-10-CM | POA: Insufficient documentation

## 2017-05-30 DIAGNOSIS — B1910 Unspecified viral hepatitis B without hepatic coma: Secondary | ICD-10-CM

## 2018-03-08 ENCOUNTER — Encounter: Payer: Self-pay | Admitting: Urgent Care

## 2018-03-08 ENCOUNTER — Other Ambulatory Visit: Payer: Self-pay

## 2018-03-08 ENCOUNTER — Ambulatory Visit: Payer: Managed Care, Other (non HMO) | Admitting: Urgent Care

## 2018-03-08 VITALS — BP 116/82 | HR 73 | Temp 98.4°F | Resp 16 | Ht 68.0 in | Wt 204.0 lb

## 2018-03-08 DIAGNOSIS — Z7189 Other specified counseling: Secondary | ICD-10-CM

## 2018-03-08 DIAGNOSIS — Z111 Encounter for screening for respiratory tuberculosis: Secondary | ICD-10-CM | POA: Diagnosis not present

## 2018-03-08 DIAGNOSIS — Z8619 Personal history of other infectious and parasitic diseases: Secondary | ICD-10-CM

## 2018-03-08 DIAGNOSIS — Z7185 Encounter for immunization safety counseling: Secondary | ICD-10-CM

## 2018-03-08 NOTE — Patient Instructions (Addendum)
We will perform a tuberculosis screen through blood work today I will also check all your titers for immunity to measles mumps rubella and hepatitis B and varicella.  I will call you and let you know when everything is completed and will complete your paperwork as appropriate.  Labs can take up to a week just to let you know what your timeline is.  Thank you for letting me help you with this today.    IF you received an x-ray today, you will receive an invoice from [facility]. Please contact [facility] at [phone] with questions or concerns regarding your invoice.   IF you received labwork today, you will receive an invoice from [place]. Please contact [facility] at [phone] with questions or concerns regarding your invoice.   Our billing staff will not be able to assist you with questions regarding bills from these companies.  You will be contacted with the lab results as soon as they are available. The fastest way to get your results is to activate your My Chart account. Instructions are located on the last page of this paperwork. If you have not heard from [place] regarding the results in 2 weeks, please contact this office.

## 2018-03-08 NOTE — Progress Notes (Signed)
  Tuberculosis Risk Questionnaire  1. Yes  Were you born outside the [place] in one of the following parts of the world: [place], [place], [place], [place] or [place]?    2. Yes  Have you traveled outside the [place] and lived for more than one month in one of the following parts of the world: [place], [place], [place], [place] or [place]?    3. No Do you have a compromised immune system such as from any of the following conditions:HIV/AIDS, organ or bone marrow transplantation, diabetes, immunosuppressive medicines (e.g. Prednisone, Remicaide), leukemia, lymphoma, cancer of the head or neck, gastrectomy or jejunal bypass, end-stage renal disease (on dialysis), or silicosis?     4. Yes  Have you ever or do you plan on working in: a residential care center, a health care facility, a jail or prison or homeless shelter?    5. No Have you ever: injected illegal drugs, used crack cocaine, lived in a homeless shelter  or been in jail or prison?     6. No Have you ever been exposed to anyone with infectious tuberculosis?  7. Yes  Have you ever had a BCG vaccine? (BCG is a vaccine for tuberculosis  (TB) used in OTHER countries, NOT in the [place]).  8. No Have you ever been advised by a health care provider NOT to have a TB skin test?  9. No Have you ever had a POSITIVE TB skin test?  IF SO, when? n/a  IF SO, were you treated with INH? n/a  IF SO, where? n/a  Tuberculosis Symptom Questionnaire  Do you currently have any of the following symptoms?  1. No Unexplained cough lasting more than 3 weeks?   2. No Unexplained fever lasting more than 3 weeks.   3. No Night Sweats (sweating that leaves the bedclothes and sheets wet)     4. No Shortness of Breath   5. No Chest Pain   6. No Unintentional weight loss    7. No Unexplained fatigue (very tired for no reason)

## 2018-03-08 NOTE — Progress Notes (Signed)
    MRN: [id number] DOB: [birth year]  Subjective:   Randy Jennings is a 32 y.o. male presenting for immunization counseling.  In 2018, patient had an MMR vaccine.  Titer showed that he had hepatitis B infection and was referred to GI for management.  Today he reports that he did not receive any medical treatment, was told that he did not need it.  His Tdap is up-to-date.  Needs to have paperwork completed for him to go to school.  Randy Jennings is not currently taking any medications.  Also is allergic to pineapple.  Randy Jennings  has a past medical history of Hepatitis B. Denies past surgical history.   Objective:   Vitals: BP 116/82   Pulse 73   Temp 98.4 F (36.9 C) (Oral)   Resp 16   Ht '5\' 8"'$  (1.727 m)   Wt 204 lb (92.5 kg)   SpO2 95%   BMI 31.02 kg/m   Physical Exam  Constitutional: He is oriented to person, place, and time. He appears well-developed and well-nourished.  Cardiovascular: Normal rate.  Pulmonary/Chest: Effort normal.  Neurological: He is alert and oriented to person, place, and time.    Assessment and Plan :   Immunization counseling - Plan: Measles/Mumps/Rubella Immunity, Hepatitis B surface antigen, Varicella zoster antibody, IgG  Screening for tuberculosis - Plan: QuantiFERON-TB Gold Plus  History of hepatitis B  Labs pending.  Will complete his paperwork as appropriate from his labs.  Jaynee Eagles, PA-C [facility] [phone] 03/08/2018  9:13 AM

## 2018-03-09 LAB — HEPATITIS B SURFACE AG, CONFIRM: HBSAG CONFIRMATION: POSITIVE — AB

## 2018-03-09 LAB — MEASLES/MUMPS/RUBELLA IMMUNITY
MUMPS ABS, IGG: 101 AU/mL (ref >10.9)
RUBEOLA AB, IGG: 150 AU/mL (ref >29.9)
Rubella Antibodies, IGG: 8.94 index (ref >0.99)

## 2018-03-09 LAB — HEPATITIS B SURFACE ANTIGEN

## 2018-03-09 LAB — VARICELLA ZOSTER ANTIBODY, IGG: VARICELLA: 696 {index} (ref >165)

## 2018-03-12 LAB — QUANTIFERON-TB GOLD PLUS
QUANTIFERON NIL VALUE: 0.08 [IU]/mL
QUANTIFERON TB2 AG VALUE: 0.04 [IU]/mL
QuantiFERON Mitogen Value: >10 IU/mL
QuantiFERON TB1 Ag Value: 0.05 IU/mL
QuantiFERON-TB Gold Plus: NEGATIVE

## 2018-03-20 ENCOUNTER — Encounter: Payer: Self-pay | Admitting: Urgent Care

## 2018-03-21 ENCOUNTER — Encounter: Payer: Self-pay | Admitting: Urgent Care

## 2018-04-22 IMAGING — US US ABDOMEN LIMITED
1 series · 14 of 25 positions shown · non-contrast
Comparison: None.

CLINICAL DATA: Hepatitis-B, hepatocellular carcinoma screening.

EXAM:
ULTRASOUND ABDOMEN LIMITED RIGHT UPPER QUADRANT

[Series 1: us abdomen limited · 0.18mm/px · 14 of 48 slices shown]
[im 1/48]
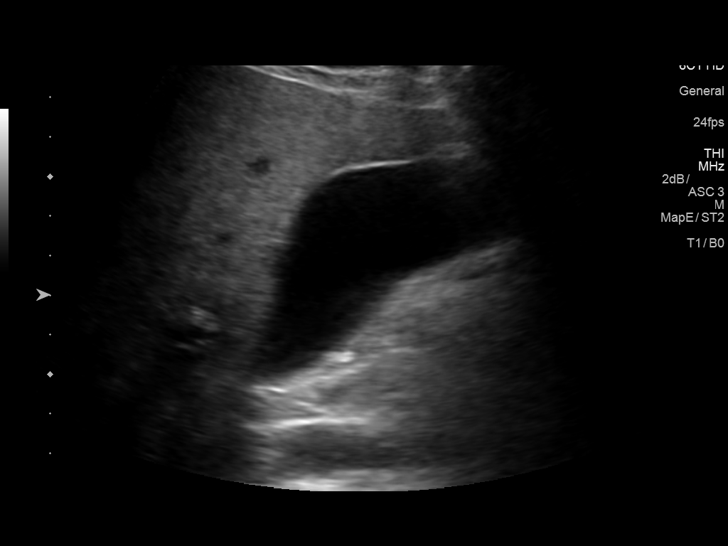
[im 4/48]
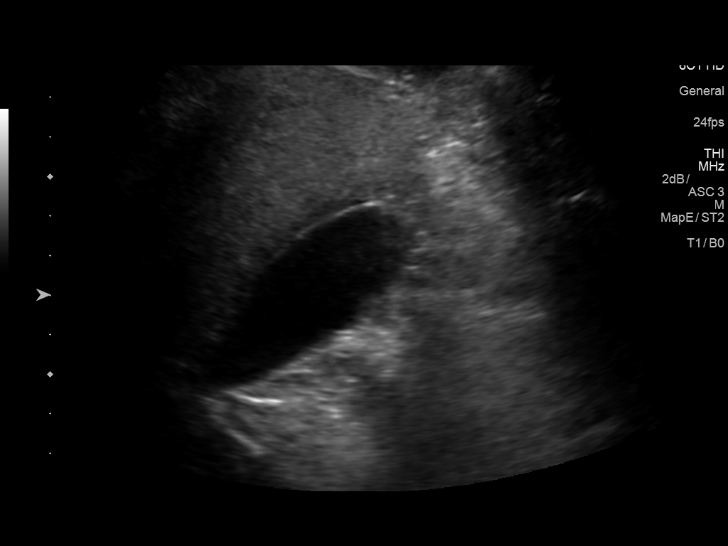
[im 8/48]
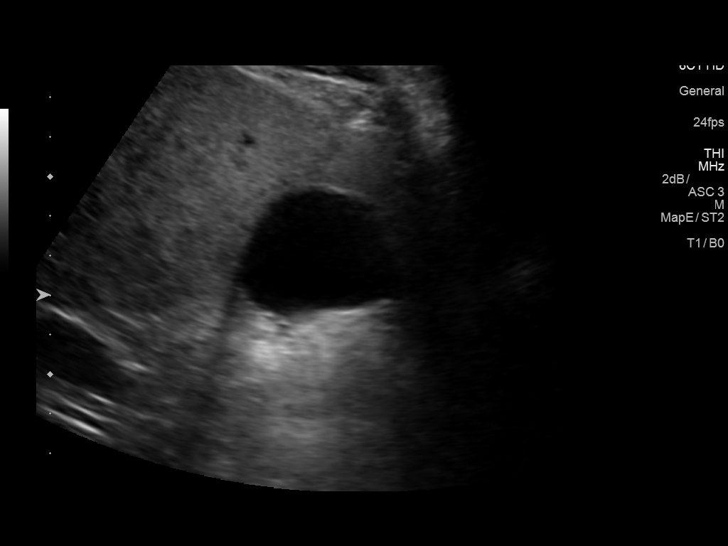
[im 12/48]
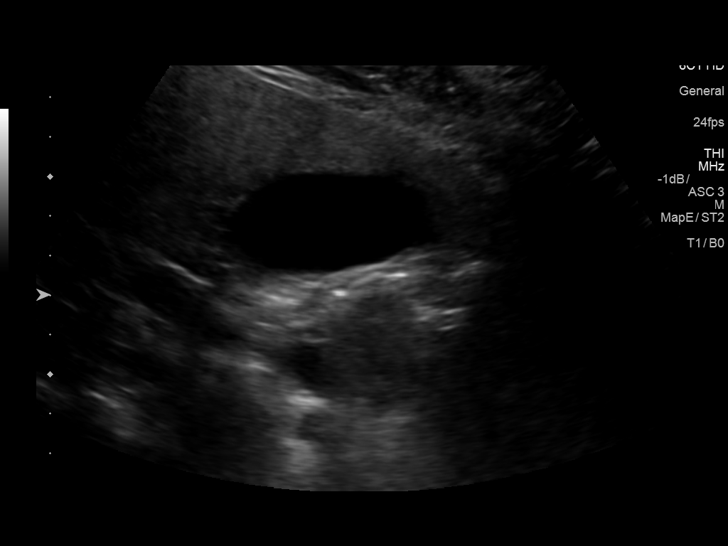
[im 16/48]
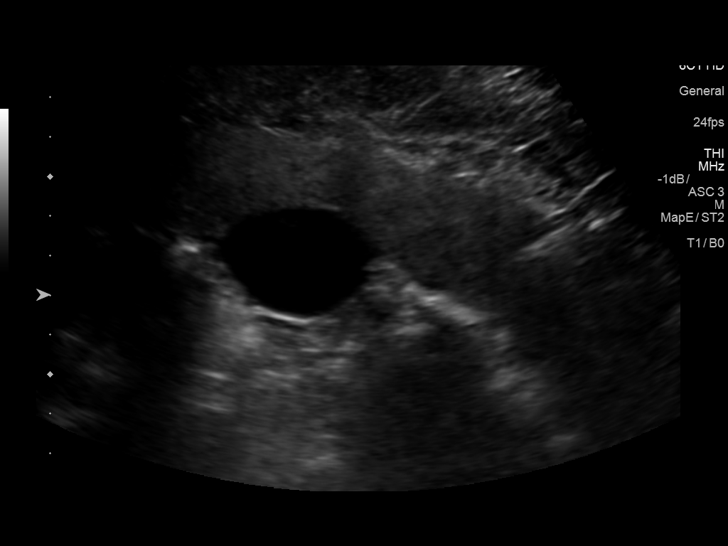
[im 18/48]
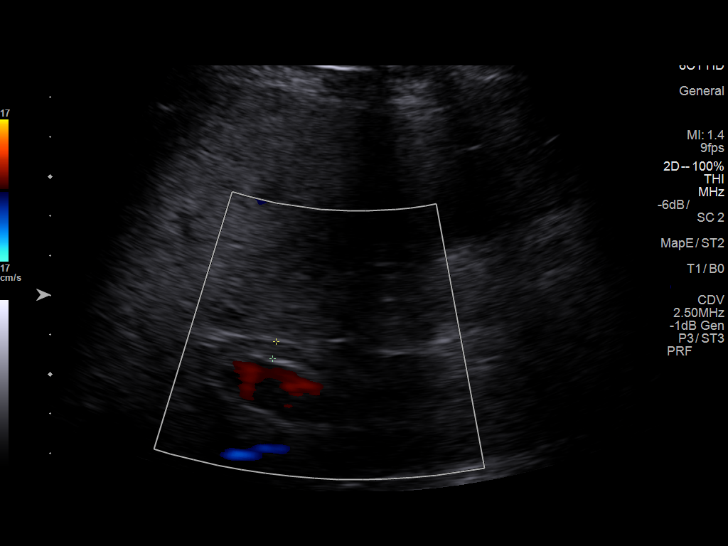
[im 22/48]
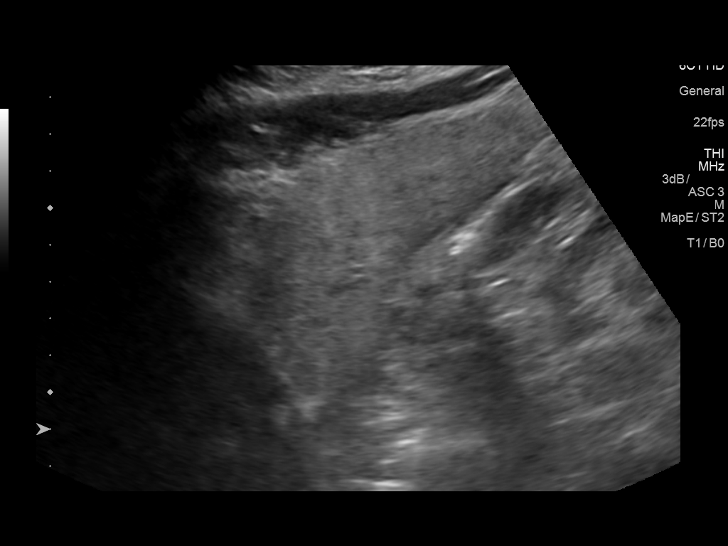
[im 26/48]
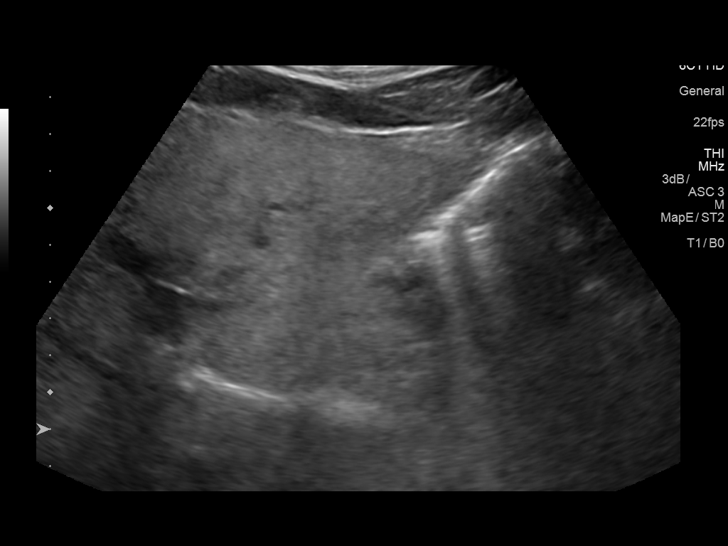
[im 30/48]
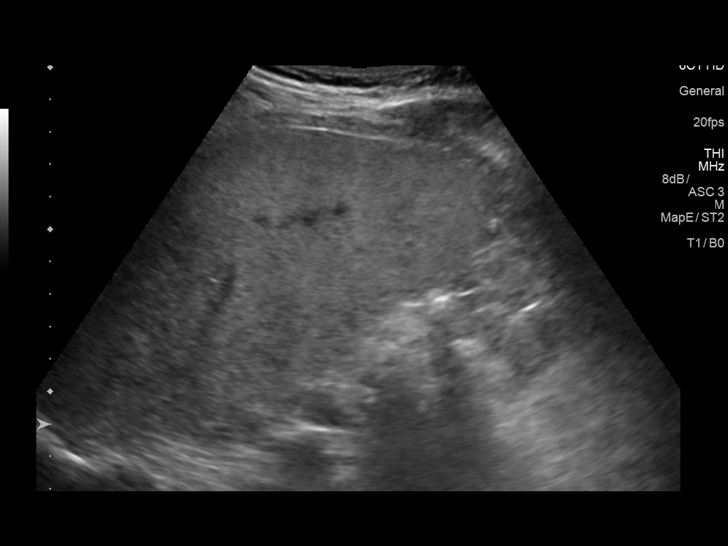
[im 32/48]
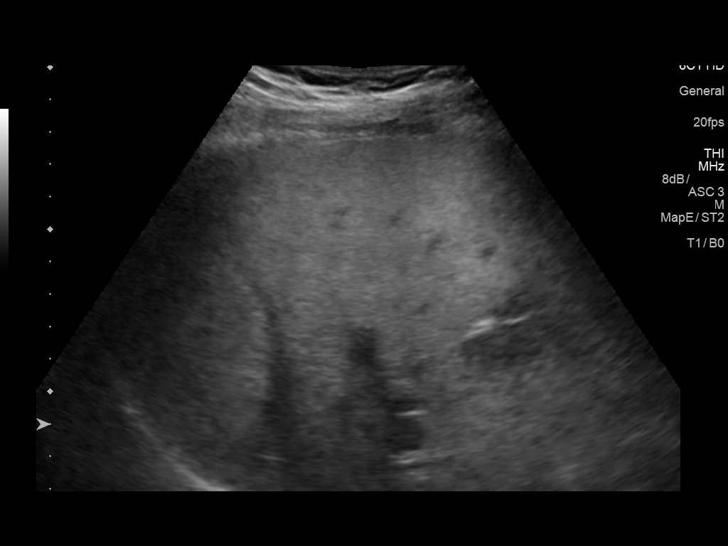
[im 36/48]
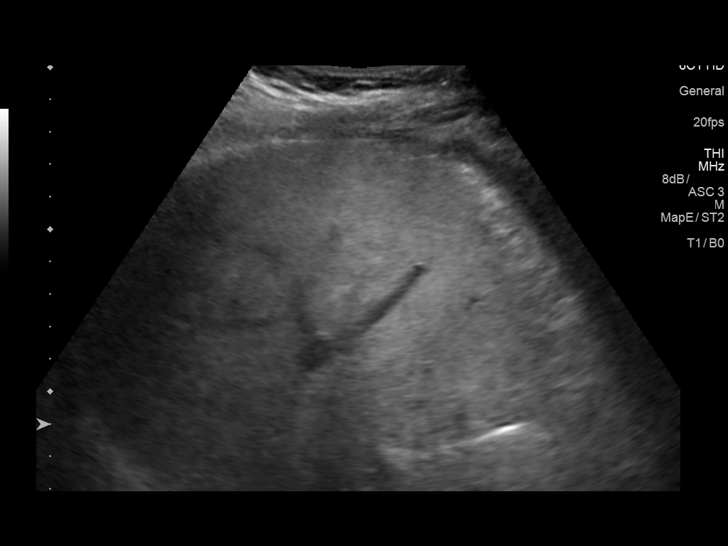
[im 40/48]
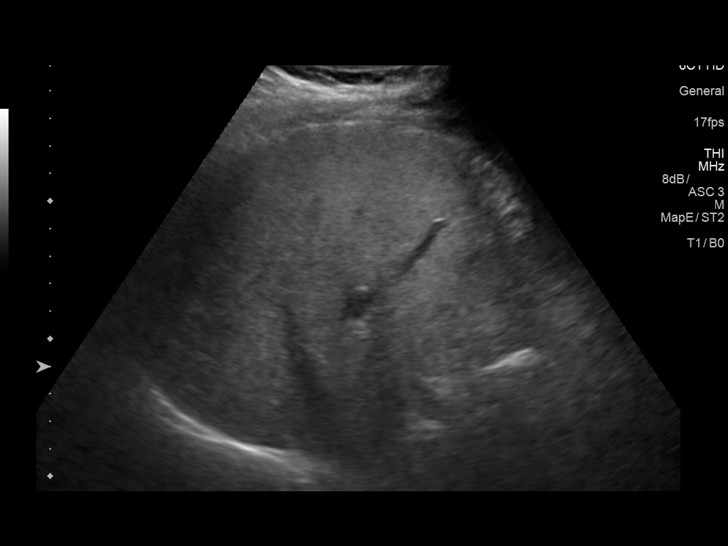
[im 44/48]
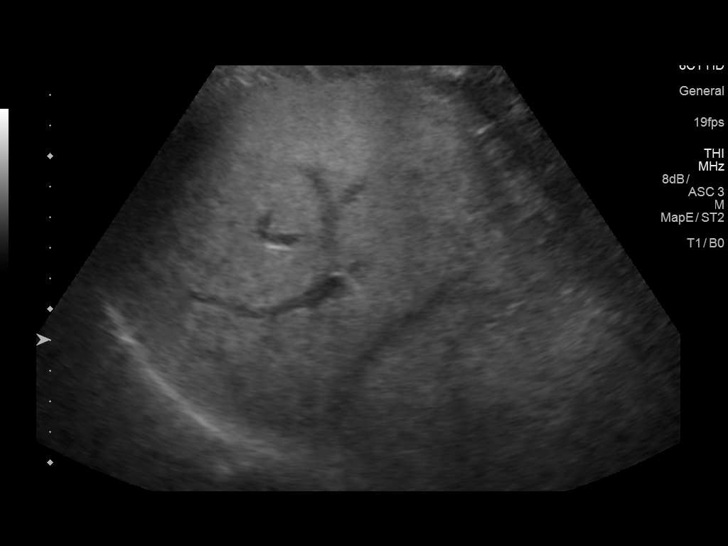
[im 48/48]
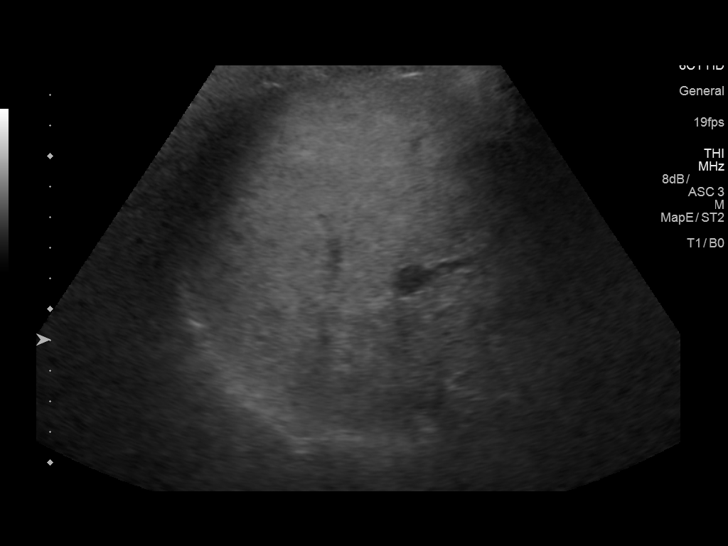

[14 of 25 positions shown; findings below may reference images not displayed]

FINDINGS: Gallbladder:

No gallstones or wall thickening visualized. No sonographic Murphy
sign noted by sonographer.

Common bile duct:

Diameter: 4 mm, within normal limits.

Liver:

Diffusely increased in echogenicity.  No definite focal lesion.
IMPRESSION: Hepatic steatosis.

## 2018-05-21 ENCOUNTER — Other Ambulatory Visit: Payer: Self-pay | Admitting: Gastroenterology

## 2018-05-21 DIAGNOSIS — B181 Chronic viral hepatitis B without delta-agent: Secondary | ICD-10-CM

## 2018-05-24 ENCOUNTER — Ambulatory Visit
Admission: RE | Admit: 2018-05-24 | Discharge: 2018-05-24 | Disposition: A | Discharge status: Home / Self Care | Payer: Managed Care, Other (non HMO) | Source: Ambulatory Visit | Attending: Gastroenterology | Admitting: Gastroenterology

## 2019-04-22 IMAGING — US US ABDOMEN LIMITED
1 series · 14 of 25 positions shown · non-contrast
Comparison: Ultrasound May 24, 2017.

CLINICAL DATA: Chronic type B viral hepatitis.

EXAM:
ULTRASOUND ABDOMEN LIMITED RIGHT UPPER QUADRANT

[Series 1: us abdomen limited · 0.20mm/px · 14 of 43 slices shown]
[im 1/43]
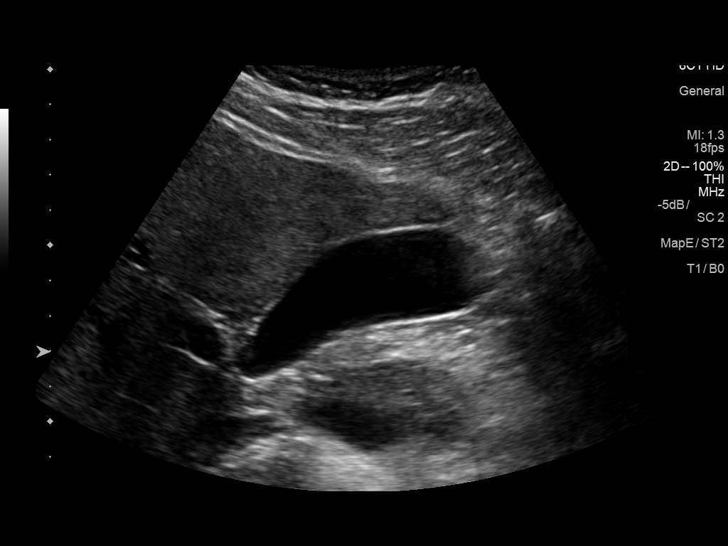
[im 4/43]
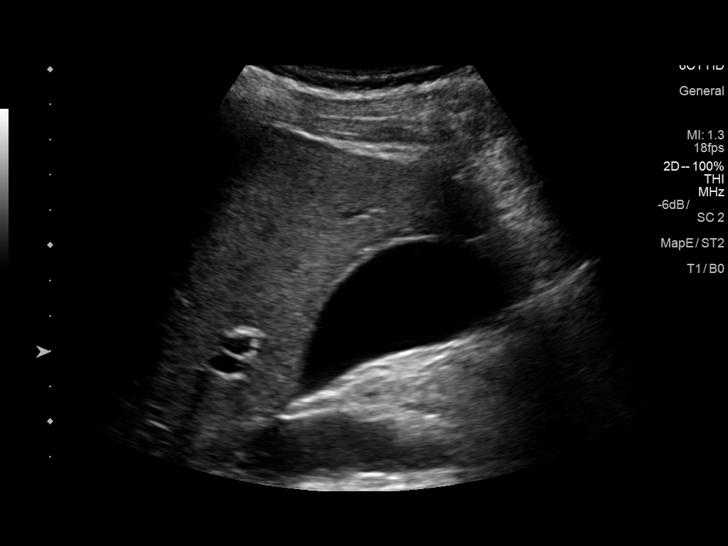
[im 8/43]
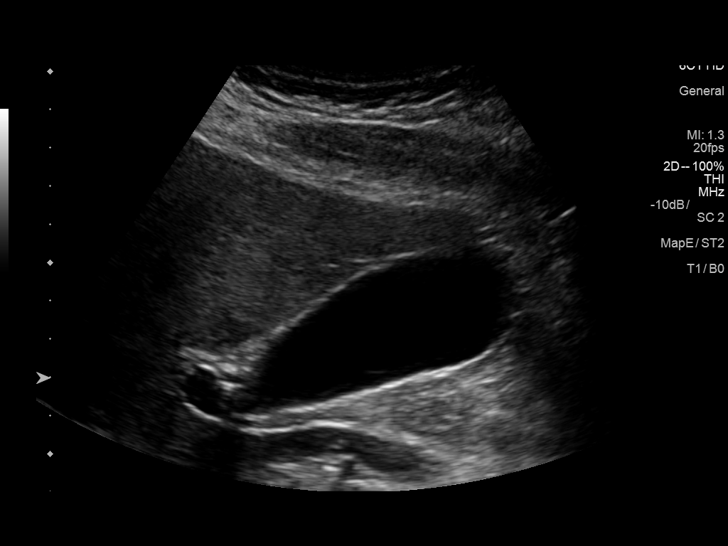
[im 11/43]
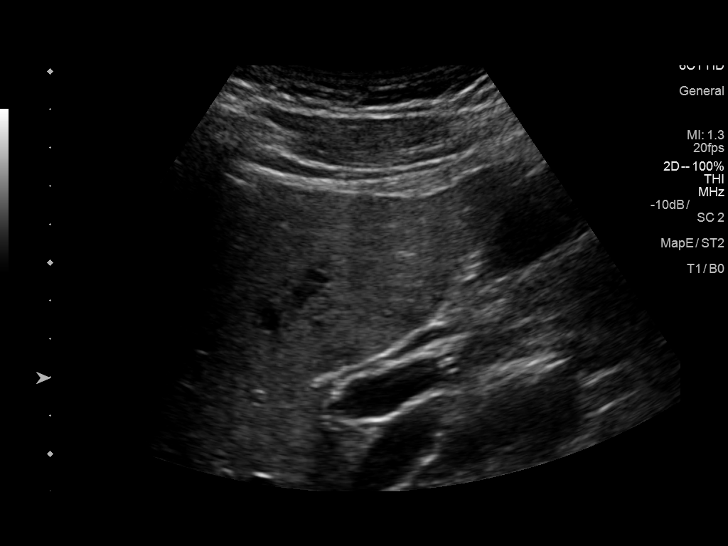
[im 15/43]
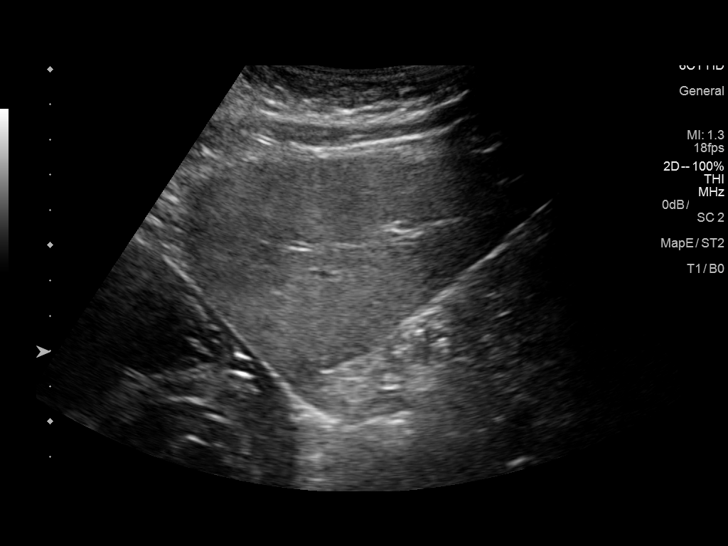
[im 16/43]
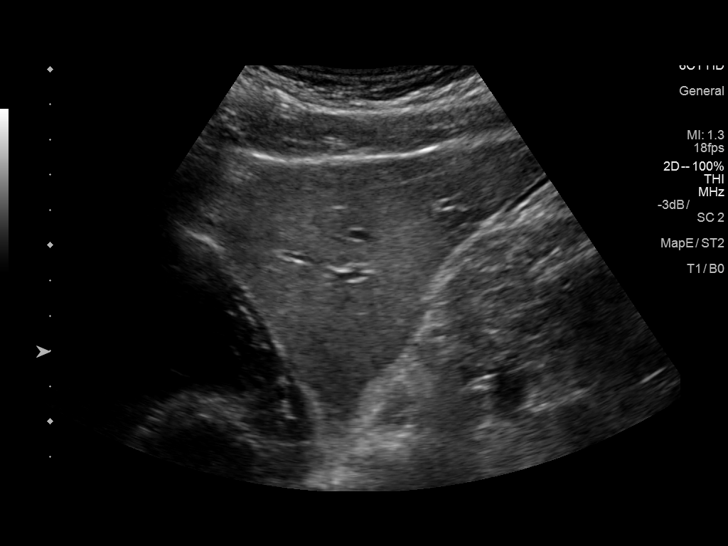
[im 20/43]
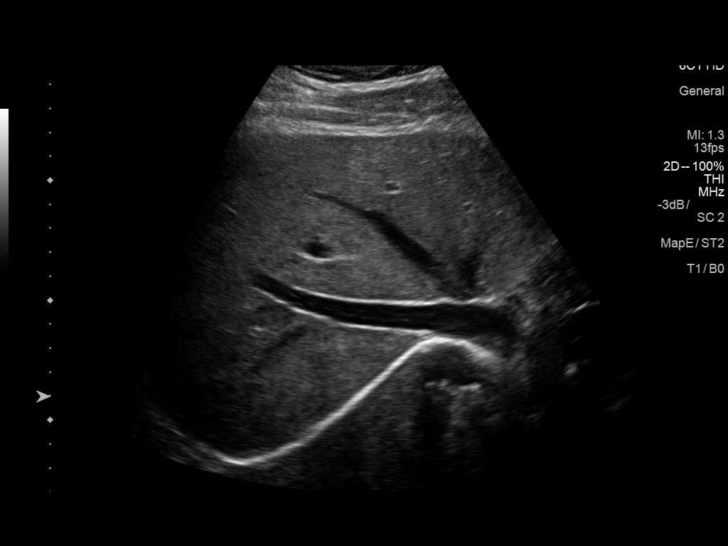
[im 23/43]
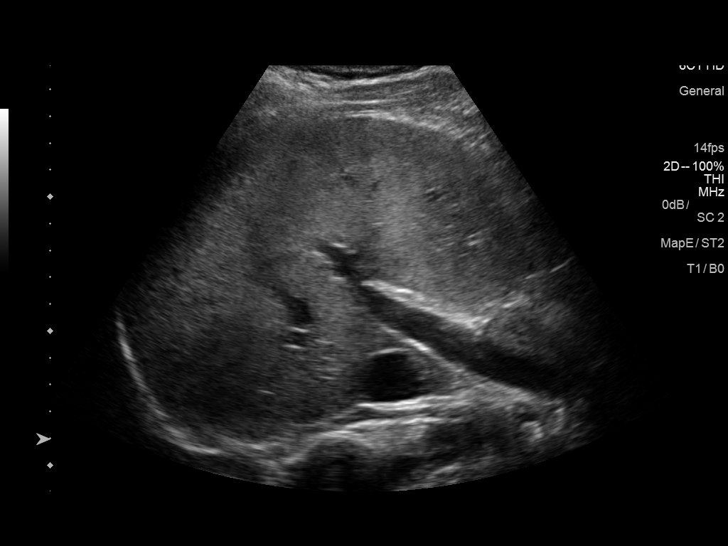
[im 27/43]
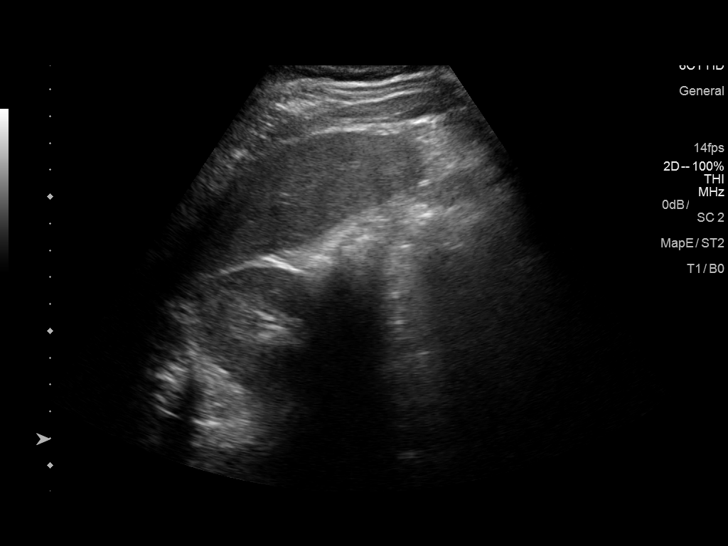
[im 29/43]
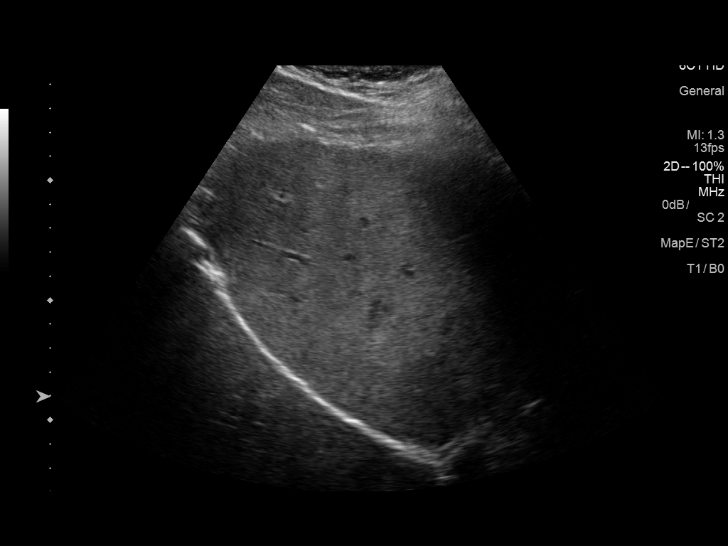
[im 32/43]
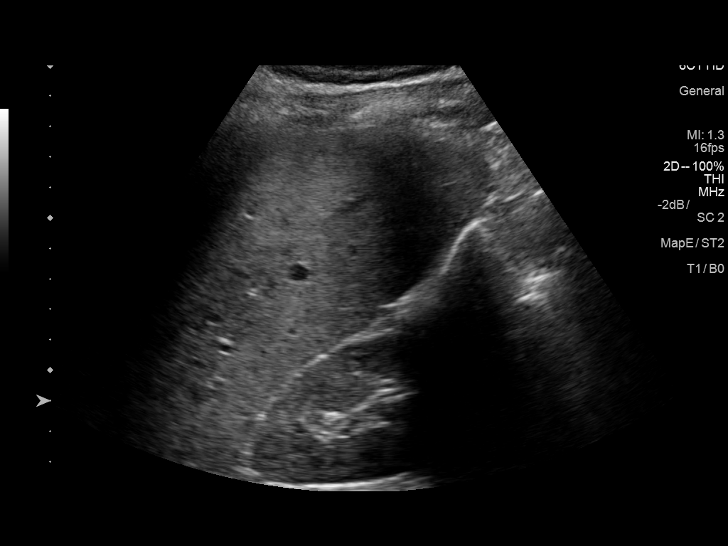
[im 36/43]
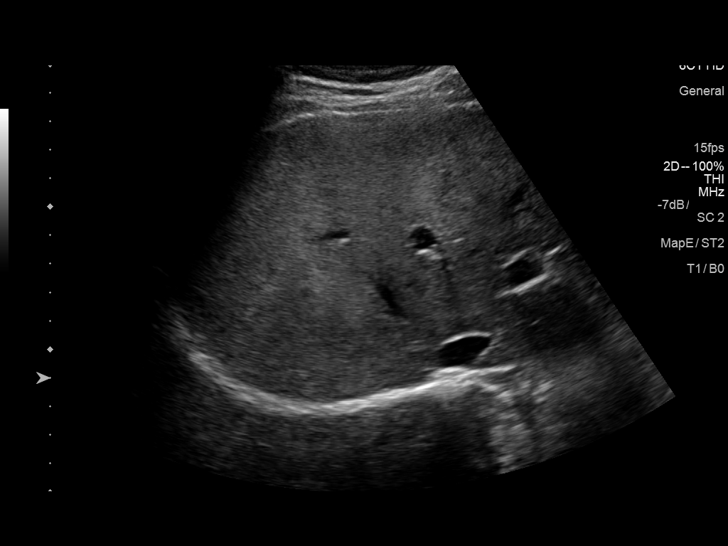
[im 39/43]
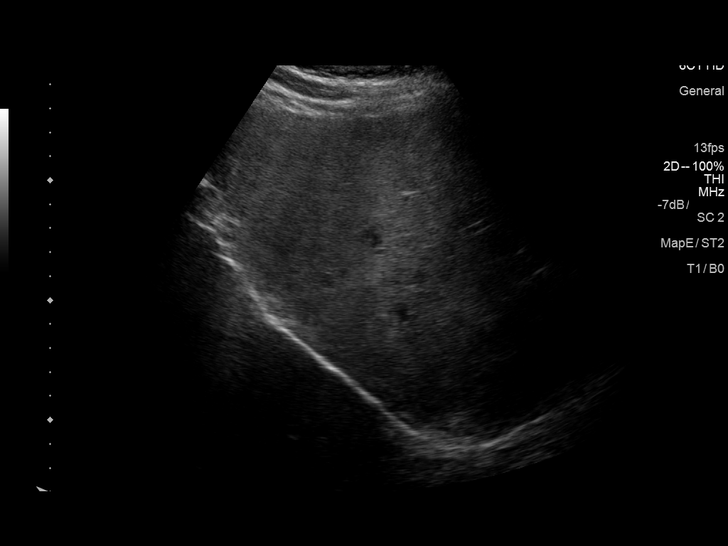
[im 43/43]
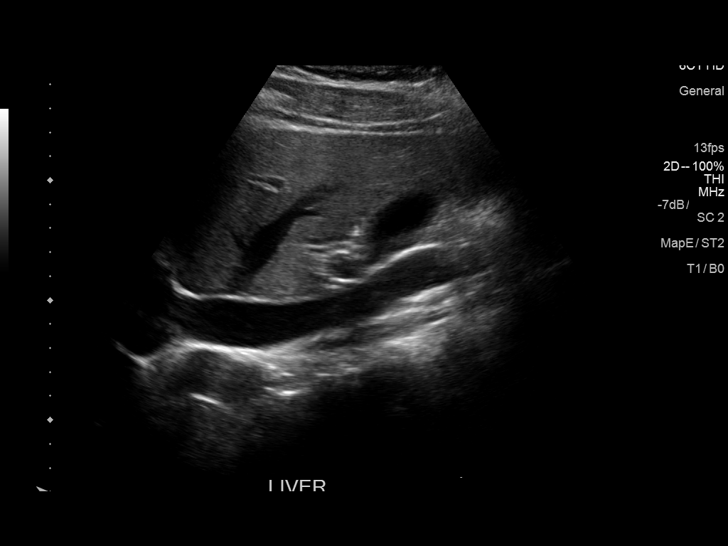

[14 of 25 positions shown; findings below may reference images not displayed]

FINDINGS: Gallbladder:

No gallstones or wall thickening visualized. No sonographic Murphy
sign noted by sonographer.

Common bile duct:

Diameter: 2.9 mm which is within normal limits.

Liver:

No focal lesion identified. Increased echogenicity of hepatic
parenchyma is noted suggesting fatty infiltration. Portal vein is
patent on color Doppler imaging with normal direction of blood flow
towards the liver.
IMPRESSION: Probable fatty infiltration of the liver. No other abnormality seen
in the right upper quadrant of the abdomen.
